# Patient Record
Sex: Female | Born: 1980 | Race: White | Hispanic: No | Smoking: Former smoker
Health system: Southern US, Community
[De-identification: ages and names within clinical notes are randomized; demographics above are authoritative.]

## PROBLEM LIST (undated history)

## (undated) DIAGNOSIS — J45909 Unspecified asthma, uncomplicated: Secondary | ICD-10-CM

---

## 2006-03-09 ENCOUNTER — Emergency Department: Payer: Self-pay | Admitting: Emergency Medicine

## 2006-04-18 ENCOUNTER — Emergency Department: Payer: Self-pay | Admitting: Unknown Physician Specialty

## 2006-07-12 ENCOUNTER — Emergency Department: Payer: Self-pay | Admitting: Emergency Medicine

## 2020-01-23 ENCOUNTER — Encounter: Payer: Self-pay | Admitting: Emergency Medicine

## 2020-01-23 ENCOUNTER — Emergency Department: Payer: Medicaid Other

## 2020-01-23 ENCOUNTER — Other Ambulatory Visit: Payer: Self-pay

## 2020-01-23 ENCOUNTER — Emergency Department
Admission: EM | Admit: 2020-01-23 | Discharge: 2020-01-23 | Disposition: A | Discharge status: Home / Self Care | Payer: Medicaid Other | Attending: Emergency Medicine | Admitting: Emergency Medicine

## 2020-01-23 DIAGNOSIS — J4521 Mild intermittent asthma with (acute) exacerbation: Secondary | ICD-10-CM | POA: Insufficient documentation

## 2020-01-23 DIAGNOSIS — Z20822 Contact with and (suspected) exposure to covid-19: Secondary | ICD-10-CM | POA: Insufficient documentation

## 2020-01-23 DIAGNOSIS — F17210 Nicotine dependence, cigarettes, uncomplicated: Secondary | ICD-10-CM | POA: Insufficient documentation

## 2020-01-23 HISTORY — DX: Unspecified asthma, uncomplicated: J45.909

## 2020-01-23 LAB — RESPIRATORY PANEL BY RT PCR (FLU A&B, COVID)
Influenza A by PCR: NEGATIVE
Influenza B by PCR: NEGATIVE
SARS Coronavirus 2 by RT PCR: NEGATIVE

## 2020-01-23 MED ORDER — MONTELUKAST SODIUM 10 MG PO TABS
10.0000 mg | ORAL_TABLET | Freq: Every day | ORAL | 0 refills | Status: AC
Start: 2020-01-23 — End: 2021-01-22

## 2020-01-23 NOTE — ED Provider Notes (Signed)
[facility] Emergency Department Provider Note   ____________________________________________   First MD Initiated Contact with Patient 01/23/20 1403     (approximate)  I have reviewed the triage vital signs and the nursing notes.   HISTORY  Chief Complaint Cough   HPI Dana Garza is a 39 y.o. female presents to the ED with complaint of cough and wheezing.  Patient states that 2 weeks ago she was seen for her asthma and URI at which time she was Covid negative.  Patient states that she went out of town and did not finish the complete dose of prednisone.  She was prescribed a Z-Pak which she did finish.  Patient states she was out in the yard yesterday and believes that her allergies have made her asthma worse.  She continues to use her inhaler.  She also is requesting another Covid test as she works in Teacher, music.  She also has not received the Covid vaccine as of today.  She rates her pain as a 0/10.      Past Medical History:  Diagnosis Date  . Asthma     There are no problems to display for this patient.   History reviewed. No pertinent surgical history.  Prior to Admission medications   Medication Sig Start Date End Date Taking? Authorizing Provider  albuterol (VENTOLIN HFA) 108 (90 Base) MCG/ACT inhaler Inhale 2 puffs into the lungs every 6 (six) hours as needed for wheezing or shortness of breath.   Yes [provider]  beclomethasone (QVAR) 40 MCG/ACT inhaler Inhale into the lungs 2 (two) times daily.   Yes [provider]  fluticasone (FLONASE) 50 MCG/ACT nasal spray Place 1 spray into both nostrils daily.   Yes [provider]  ipratropium-albuterol (DUONEB) 0.5-2.5 (3) MG/3ML SOLN Take 3 mLs by nebulization.   Yes [provider]  montelukast (SINGULAIR) 10 MG tablet Take 1 tablet (10 mg total) by mouth at bedtime. 01/23/20 01/22/21  Tommi Rumps, PA-C    Allergies Sulfa antibiotics  History  reviewed. No pertinent family history.  Social History Social History   Tobacco Use  . Smoking status: Former Games developer  . Smokeless tobacco: Never Used  Substance Use Topics  . Alcohol use: Not on file  . Drug use: Not on file    Review of Systems Constitutional: No fever/chills Eyes: No visual changes. ENT: No sore throat.  Negative for ear pain.  Positive for seasonal allergies. Cardiovascular: Denies chest pain. Respiratory: Denies shortness of breath.  Positive for cough and wheezing. Gastrointestinal: No abdominal pain.  No nausea, no vomiting.  Musculoskeletal: Negative for back pain. Skin: Negative for rash. Neurological: Negative for headaches, focal weakness or numbness. ____________________________________________   PHYSICAL EXAM:  VITAL SIGNS: ED Triage Vitals  Enc Vitals Group     BP 01/23/20 1339 113/71     Pulse Rate 01/23/20 1339 88     Resp 01/23/20 1339 18     Temp 01/23/20 1339 98.2 F (36.8 C)     Temp Source 01/23/20 1339 Oral     SpO2 01/23/20 1339 98 %     Weight 01/23/20 1340 253 lb (114.8 kg)     Height 01/23/20 1340 5\' 7"  (1.702 m)     Head Circumference --      Peak Flow --      Pain Score 01/23/20 1340 0     Pain Loc --      Pain Edu? --  Excl. in [place]? --    Constitutional: Alert and oriented. Well appearing and in no acute distress. Eyes: Conjunctivae are normal.  Head: Atraumatic. Nose: No congestion/rhinnorhea. Neck: No stridor.   Cardiovascular: Normal rate, regular rhythm. Grossly normal heart sounds.  Good peripheral circulation. Respiratory: Normal respiratory effort.  No retractions. Lungs occasional faint expiratory wheezes heard and patient also has a nonproductive cough.  Patient is also a cigarette smoker. Gastrointestinal: Soft and nontender. No distention. No abdominal bruits. No CVA tenderness. Musculoskeletal: Moves upper and lower extremities with any difficulty normal gait was noted. Neurologic:  Normal speech and  language. No gross focal neurologic deficits are appreciated. No gait instability. Skin:  Skin is warm, dry and intact. No rash noted. Psychiatric: Mood and affect are normal. Speech and behavior are normal.  ____________________________________________   LABS (all labs ordered are listed, but only abnormal results are displayed)  Labs Reviewed  RESPIRATORY PANEL BY RT PCR (FLU A&B, COVID)    RADIOLOGY  Official radiology report(s): DG Chest Portable 1 View  Result Date: 01/23/2020 CLINICAL DATA:  Cough 2 weeks ago with wheezing. Negative COVID-19 test. EXAM: PORTABLE CHEST 1 VIEW COMPARISON:  None. FINDINGS: Lungs are adequately inflated and otherwise clear. Cardiomediastinal silhouette, bones and soft tissues are normal. IMPRESSION: No active disease. Electronically Signed   By: Marin Olp M.D.   On: 01/23/2020 15:02    ____________________________________________   PROCEDURES  Procedure(s) performed (including Critical Care):  Procedures   ____________________________________________   INITIAL IMPRESSION / ASSESSMENT AND PLAN / ED COURSE  As part of my medical decision making, I reviewed the following data within the electronic MEDICAL RECORD NUMBER Notes from prior ED visits and [place] Controlled Substance [place] was evaluated in Emergency Department on 01/23/2020 for the symptoms described in the history of present illness. She was evaluated in the context of the global COVID-19 pandemic, which necessitated consideration that the patient might be at risk for infection with the SARS-CoV-2 virus that causes COVID-19. Institutional protocols and algorithms that pertain to the evaluation of patients at risk for COVID-19 are in a state of rapid change based on information released by regulatory bodies including the CDC and federal and state organizations. These policies and algorithms were followed during the patient's care in the ED.  39 year old female presents  to the ED with complaint of URI and asthma exacerbation.  Patient states she was diagnosed with a URI on 01/12/2020 at which time she was prescribed a Z-Pak and prednisone.  She states she did not take the prednisone completely but did finish the Z-Pak.  Patient chest x-ray was negative for any acute changes.  Patient was made aware that most likely the allergy season has exacerbated her asthma.  Patient will continue using her inhaler and also a prescription for Singulair was sent to the pharmacy.  She is to follow-up with her PCP if any continued problems. ____________________________________________   FINAL CLINICAL IMPRESSION(S) / ED DIAGNOSES  Final diagnoses:  Mild intermittent asthma with exacerbation  Cigarette smoker     ED Discharge Orders         Ordered    montelukast (SINGULAIR) 10 MG tablet  Daily at bedtime     01/23/20 1510           Note:  This document was prepared using Dragon voice recognition software and may include unintentional dictation errors.    Johnn Hai, PA-C 01/23/20 1611    Lavonia Drafts, MD 01/27/20 [id number]

## 2020-01-23 NOTE — ED Notes (Signed)
See triage note  Presents with cough and wheezing  States she was treated for URI about 2 weeks ago  Had negative COVID test at that time  States she went out of town  Became fatigued  Worked in yard and developed wheezing and SOB  Afebrile at present

## 2020-01-23 NOTE — Discharge Instructions (Addendum)
Follow-up with your primary care provider or can no clinic acute care if any continued problems.  Continue using your inhaler as directed.  Singulair is added to your asthma routine.  Increase fluids.  Also decrease or discontinue smoking.  Chest x-ray today does not show any signs of infection.  You may see the results of your Covid test on my chart.

## 2020-01-23 NOTE — ED Triage Notes (Signed)
Pt arrived via POV with reports of being dx with URI on 4/21, was prescribed Z-pak and completed course, was also prescribed prednisone, but did not complete course in succession due to traveling out of the state and leaving medications behind. Pt states when she got back she took the last 2 doses of prednisone and felt better.  Pt states she has also been working out in the yard so thinks her allergies have exacerbated her asthma.  Uses inhaler at home.  Requesting to have another COVID test done, first one was negative at [facility].

## 2020-01-27 ENCOUNTER — Other Ambulatory Visit: Payer: Self-pay

## 2020-01-27 ENCOUNTER — Emergency Department
Admission: EM | Admit: 2020-01-27 | Discharge: 2020-01-27 | Disposition: A | Discharge status: Home / Self Care | Payer: Medicaid Other | Attending: Emergency Medicine | Admitting: Emergency Medicine

## 2020-01-27 DIAGNOSIS — K529 Noninfective gastroenteritis and colitis, unspecified: Secondary | ICD-10-CM | POA: Insufficient documentation

## 2020-01-27 DIAGNOSIS — Z79899 Other long term (current) drug therapy: Secondary | ICD-10-CM | POA: Insufficient documentation

## 2020-01-27 DIAGNOSIS — Z20822 Contact with and (suspected) exposure to covid-19: Secondary | ICD-10-CM | POA: Insufficient documentation

## 2020-01-27 DIAGNOSIS — Z87891 Personal history of nicotine dependence: Secondary | ICD-10-CM | POA: Insufficient documentation

## 2020-01-27 LAB — COMPREHENSIVE METABOLIC PANEL
ALT: 21 U/L (ref 0–44)
AST: 16 U/L (ref 15–41)
Albumin: 4.4 g/dL (ref 3.5–5.0)
Alkaline Phosphatase: 37 U/L — ABNORMAL LOW (ref 38–126)
Anion gap: 10 (ref 5–15)
BUN: 14 mg/dL (ref 6–20)
CO2: 22 mmol/L (ref 22–32)
Calcium: 9.4 mg/dL (ref 8.9–10.3)
Chloride: 108 mmol/L (ref 98–111)
Creatinine, Ser: 0.78 mg/dL (ref 0.44–1.00)
GFR calc Af Amer: >60 mL/min (ref >60)
GFR calc non Af Amer: >60 mL/min (ref >60)
Glucose, Bld: 147 mg/dL — ABNORMAL HIGH (ref 70–99)
Potassium: 3.8 mmol/L (ref 3.5–5.1)
Sodium: 140 mmol/L (ref 135–145)
Total Bilirubin: 1.2 mg/dL (ref 0.3–1.2)
Total Protein: 7.1 g/dL (ref 6.5–8.1)

## 2020-01-27 LAB — CBC
HCT: 45.9 % (ref 36.0–46.0)
Hemoglobin: 16.4 g/dL — ABNORMAL HIGH (ref 12.0–15.0)
MCH: 30.3 pg (ref 26.0–34.0)
MCHC: 35.7 g/dL (ref 30.0–36.0)
MCV: 84.7 fL (ref 80.0–100.0)
Platelets: 241 10*3/uL (ref 150–400)
RBC: 5.42 MIL/uL — ABNORMAL HIGH (ref 3.87–5.11)
RDW: 13 % (ref 11.5–15.5)
WBC: 15.9 10*3/uL — ABNORMAL HIGH (ref 4.0–10.5)
nRBC: 0 % (ref 0.0–0.2)

## 2020-01-27 LAB — LIPASE, BLOOD: Lipase: 24 U/L (ref 11–51)

## 2020-01-27 LAB — POC SARS CORONAVIRUS 2 AG: SARS Coronavirus 2 Ag: NEGATIVE

## 2020-01-27 MED ORDER — ONDANSETRON 4 MG PO TBDP
4.0000 mg | ORAL_TABLET | Freq: Once | ORAL | Status: AC | PRN
  Administered 2020-01-27: 4 mg via ORAL
  Filled 2020-01-27: qty 1

## 2020-01-27 MED ORDER — ONDANSETRON 4 MG PO TBDP: ORAL_TABLET | ORAL | 0 refills | Status: AC

## 2020-01-27 MED ORDER — ONDANSETRON 4 MG PO TBDP
4.0000 mg | ORAL_TABLET | Freq: Once | ORAL | Status: AC
  Administered 2020-01-27: 4 mg via ORAL
  Filled 2020-01-27: qty 1

## 2020-01-27 MED ORDER — SODIUM CHLORIDE 0.9% FLUSH: 3.0000 mL | Freq: Once | INTRAVENOUS | Status: DC

## 2020-01-27 NOTE — ED Provider Notes (Signed)
[facility] Emergency Department Provider Note  ____________________________________________   First MD Initiated Contact with Patient 01/27/20 2257     (approximate)  I have reviewed the triage vital signs and the nursing notes.   HISTORY  Chief Complaint Abdominal Pain    HPI Dana Garza is a 39 y.o. female with no contributory past medical history who presents for evaluation of acute onset and severe and recurrent episodes of vomiting and diarrhea.  This started a few minutes before coming to the emergency department which was nearly 5-1/2 hours prior to my assessment of the patient.  She is having some intermittent sharp and cramping abdominal pain but nothing consistent and seem to be related to the multiple episodes of vomiting.  She said that it was "coming out both ends" at the same time so she was having to lie in the bathroom floor so she would be near the toilet .  She does not think it was anything she ate but thinks that something going through the family; at least one of her children and another family member have had similar symptoms over the last couple of days.  She was tested for Covid with a PCR test 4 days ago which was negative but she wanted to be tested again tonight.  No fever/chills, sore throat, or acute chest pain or shortness of breath although she has had some shortness of breath recently as well as a cough (for which she was seen 4 days ago when she was tested for Covid).  She describes the symptoms as severe and nothing in particular makes them better or worse.  She said that she did feel little bit better after getting the Zofran earlier in her body has calm down, she has not vomited or had diarrhea more than an hour, and she is feeling much better than she did earlier.          Past Medical History:  Diagnosis Date  . Asthma     There are no problems to display for this patient.   History reviewed. No pertinent  surgical history.  Prior to Admission medications   Medication Sig Start Date End Date Taking? Authorizing Provider  albuterol (VENTOLIN HFA) 108 (90 Base) MCG/ACT inhaler Inhale 2 puffs into the lungs every 6 (six) hours as needed for wheezing or shortness of breath.   Yes [provider]  beclomethasone (QVAR) 40 MCG/ACT inhaler Inhale into the lungs 2 (two) times daily.   Yes [provider]  fluticasone (FLONASE) 50 MCG/ACT nasal spray Place 1 spray into both nostrils daily.   Yes [provider]  ipratropium-albuterol (DUONEB) 0.5-2.5 (3) MG/3ML SOLN Take 3 mLs by nebulization every 4 (four) hours as needed.    Yes [provider]  montelukast (SINGULAIR) 10 MG tablet Take 1 tablet (10 mg total) by mouth at bedtime. 01/23/20 01/22/21 Yes Bridget Hartshorn L, PA-C  ondansetron (ZOFRAN ODT) 4 MG disintegrating tablet Allow 1-2 tablets to dissolve in your mouth every 8 hours as needed for nausea/vomiting 01/27/20   Loleta Rose, MD    Allergies Sulfa antibiotics  No family history on file.  Social History Social History   Tobacco Use  . Smoking status: Former Games developer  . Smokeless tobacco: Never Used  Substance Use Topics  . Alcohol use: Not on file  . Drug use: Not on file    Review of Systems Constitutional: No fever/chills Eyes: No visual changes. ENT: No sore throat. Cardiovascular: Denies chest pain.  Respiratory: Denies acute shortness of breath although she has had some recent cough and wheezing. Gastrointestinal: Acute onset vomiting and diarrhea, intermittent cramping abdominal pain with occasional sharp pain but nothing persistent.  Improved over the last hour. Genitourinary: Negative for dysuria. Musculoskeletal: Negative for neck pain.  Negative for back pain. Integumentary: Negative for rash. Neurological: Negative for headaches, focal weakness or numbness.   ____________________________________________   PHYSICAL EXAM:  VITAL  SIGNS: ED Triage Vitals  Enc Vitals Group     BP 01/27/20 1812 136/69     Pulse Rate 01/27/20 1812 70     Resp 01/27/20 1812 18     Temp 01/27/20 1815 (!) 97.4 F (36.3 C)     Temp Source 01/27/20 1815 Oral     SpO2 01/27/20 1812 99 %     Weight 01/27/20 1810 114.8 kg (253 lb)     Height 01/27/20 1810 1.702 m (5\' 7" )     Head Circumference --      Peak Flow --      Pain Score 01/27/20 1810 6     Pain Loc --      Pain Edu? --      Excl. in [place]? --     Constitutional: Alert and oriented.  Eyes: Conjunctivae are normal.  Head: Atraumatic. Nose: No congestion/rhinnorhea. Mouth/Throat: Patient is wearing a mask. Neck: No stridor.  No meningeal signs.   Cardiovascular: Normal rate, regular rhythm. Good peripheral circulation. Grossly normal heart sounds. Respiratory: Normal respiratory effort.  No retractions. Gastrointestinal: Soft and nontender. No distention.  Musculoskeletal: No lower extremity tenderness nor edema. No gross deformities of extremities. Neurologic:  Normal speech and language. No gross focal neurologic deficits are appreciated.  Skin:  Skin is warm, dry and intact. Psychiatric: Mood and affect are normal. Speech and behavior are normal.  ____________________________________________   LABS (all labs ordered are listed, but only abnormal results are displayed)  Labs Reviewed  COMPREHENSIVE METABOLIC PANEL - Abnormal; Notable for the following components:      Result Value   Glucose, Bld 147 (*)    Alkaline Phosphatase 37 (*)    All other components within normal limits  CBC - Abnormal; Notable for the following components:   WBC 15.9 (*)    RBC 5.42 (*)    Hemoglobin 16.4 (*)    All other components within normal limits  LIPASE, BLOOD  URINALYSIS, COMPLETE (UACMP) WITH MICROSCOPIC  POC SARS CORONAVIRUS 2 AG -  ED  POC SARS CORONAVIRUS 2 AG  POC URINE PREG, ED   ____________________________________________  EKG  None - EKG not ordered by ED  physician ____________________________________________  RADIOLOGY Ursula Alert, personally viewed and evaluated these images (plain radiographs) as part of my medical decision making, as well as reviewing the written report by the radiologist.  ED MD interpretation: No indication for emergent imaging  Official radiology report(s): No results found.  ____________________________________________   PROCEDURES   Procedure(s) performed (including Critical Care):  Procedures   ____________________________________________   INITIAL IMPRESSION / MDM / [place] / ED COURSE  As part of my medical decision making, I reviewed the following data within the [facility] notes reviewed and incorporated, Labs reviewed , Old chart reviewed, Notes from prior ED visits and [place] Controlled Substance Database   Differential diagnosis includes, but is not limited to, viral gastroenteritis, bacterial enteritis such as Salmonella or Campylobacter, other nonspecific intra-abdominal infection, COVID-19.  The patient has had a recent negative  PCR test and she received a negative rapid antigen test tonight.  She has no other acute respiratory symptoms.  Her vital signs are stable and within normal limits.  She has been in the emergency department 5 and half hours and has had no vomiting or diarrhea for the last hour.  She is feeling much better and has no tenderness to palpation of her abdomen.  Her labs demonstrate a moderate leukocytosis which is nonspecific and not necessarily indicative of a bacterial infection or need for additional imaging given her reassuring abdominal exam.  She is tolerating ginger ale in the ED and wants to go home.  She has multiple family members with similar symptoms.  I am discharging the patient with a prescription for Zofran and my usual and customary management recommendations and return precautions.  She understands and agrees with the  plan.    ____________________________________________  FINAL CLINICAL IMPRESSION(S) / ED DIAGNOSES  Final diagnoses:  Gastroenteritis     MEDICATIONS GIVEN DURING THIS VISIT:  Medications  sodium chloride flush (NS) 0.9 % injection 3 mL (3 mLs Intravenous Not Given 01/27/20 2311)  ondansetron (ZOFRAN-ODT) disintegrating tablet 4 mg (has no administration in time range)  ondansetron (ZOFRAN-ODT) disintegrating tablet 4 mg (4 mg Oral Given 01/27/20 1816)     ED Discharge Orders         Ordered    ondansetron (ZOFRAN ODT) 4 MG disintegrating tablet     01/27/20 2317          *Please note:  Dana Garza was evaluated in Emergency Department on 01/27/2020 for the symptoms described in the history of present illness. She was evaluated in the context of the global COVID-19 pandemic, which necessitated consideration that the patient might be at risk for infection with the SARS-CoV-2 virus that causes COVID-19. Institutional protocols and algorithms that pertain to the evaluation of patients at risk for COVID-19 are in a state of rapid change based on information released by regulatory bodies including the CDC and federal and state organizations. These policies and algorithms were followed during the patient's care in the ED.  Some ED evaluations and interventions may be delayed as a result of limited staffing during the pandemic.*  Note:  This document was prepared using Dragon voice recognition software and may include unintentional dictation errors.   Loleta Rose, MD 01/27/20 2320

## 2020-01-27 NOTE — ED Notes (Signed)
Pt stuck twice was able to collect red top and sent to lab. Pt squirming around and wouldn't sit still to collect the rest of labs.

## 2020-01-27 NOTE — Discharge Instructions (Addendum)

## 2020-01-27 NOTE — ED Triage Notes (Signed)
Pt comes via [facility] from home with c/o N/V and abdominal pain that started a few minutes ago.  Pt states she thinks she has a virus.

## 2020-05-10 ENCOUNTER — Other Ambulatory Visit: Payer: Self-pay

## 2020-05-10 ENCOUNTER — Ambulatory Visit
Admission: EM | Admit: 2020-05-10 | Discharge: 2020-05-10 | Disposition: A | Discharge status: Home / Self Care | Payer: Medicaid Other | Attending: Internal Medicine | Admitting: Internal Medicine

## 2020-05-10 DIAGNOSIS — Z20822 Contact with and (suspected) exposure to covid-19: Secondary | ICD-10-CM

## 2020-05-10 LAB — SARS CORONAVIRUS 2 (TAT 6-24 HRS): SARS Coronavirus 2: NEGATIVE

## 2020-05-10 NOTE — ED Triage Notes (Signed)
Patient in today for COVID exposure. 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

## 2020-08-06 ENCOUNTER — Emergency Department
Admission: EM | Admit: 2020-08-06 | Discharge: 2020-08-06 | Disposition: A | Discharge status: Home / Self Care | Payer: Medicaid Other | Attending: Emergency Medicine | Admitting: Emergency Medicine

## 2020-08-06 ENCOUNTER — Encounter: Payer: Self-pay | Admitting: Physician Assistant

## 2020-08-06 ENCOUNTER — Other Ambulatory Visit: Payer: Self-pay

## 2020-08-06 DIAGNOSIS — J45909 Unspecified asthma, uncomplicated: Secondary | ICD-10-CM | POA: Insufficient documentation

## 2020-08-06 DIAGNOSIS — Z87891 Personal history of nicotine dependence: Secondary | ICD-10-CM | POA: Insufficient documentation

## 2020-08-06 DIAGNOSIS — Z7951 Long term (current) use of inhaled steroids: Secondary | ICD-10-CM | POA: Insufficient documentation

## 2020-08-06 DIAGNOSIS — L03311 Cellulitis of abdominal wall: Secondary | ICD-10-CM | POA: Insufficient documentation

## 2020-08-06 LAB — BASIC METABOLIC PANEL
Anion gap: 10 (ref 5–15)
BUN: 12 mg/dL (ref 6–20)
CO2: 26 mmol/L (ref 22–32)
Calcium: 9.1 mg/dL (ref 8.9–10.3)
Chloride: 103 mmol/L (ref 98–111)
Creatinine, Ser: 0.75 mg/dL (ref 0.44–1.00)
GFR, Estimated: >60 mL/min (ref >60)
Glucose, Bld: 112 mg/dL — ABNORMAL HIGH (ref 70–99)
Potassium: 4.2 mmol/L (ref 3.5–5.1)
Sodium: 139 mmol/L (ref 135–145)

## 2020-08-06 LAB — CBC WITH DIFFERENTIAL/PLATELET
Abs Immature Granulocytes: 0.04 10*3/uL (ref 0.00–0.07)
Basophils Absolute: 0 10*3/uL (ref 0.0–0.1)
Basophils Relative: 0 %
Eosinophils Absolute: 0.3 10*3/uL (ref 0.0–0.5)
Eosinophils Relative: 3 %
HCT: 43.6 % (ref 36.0–46.0)
Hemoglobin: 14.6 g/dL (ref 12.0–15.0)
Immature Granulocytes: 0 %
Lymphocytes Relative: 10 %
Lymphs Abs: 1.3 10*3/uL (ref 0.7–4.0)
MCH: 29.9 pg (ref 26.0–34.0)
MCHC: 33.5 g/dL (ref 30.0–36.0)
MCV: 89.2 fL (ref 80.0–100.0)
Monocytes Absolute: 0.7 10*3/uL (ref 0.1–1.0)
Monocytes Relative: 5 %
Neutro Abs: 10.7 10*3/uL — ABNORMAL HIGH (ref 1.7–7.7)
Neutrophils Relative %: 82 %
Platelets: 207 10*3/uL (ref 150–400)
RBC: 4.89 MIL/uL (ref 3.87–5.11)
RDW: 13.3 % (ref 11.5–15.5)
WBC: 13.1 10*3/uL — ABNORMAL HIGH (ref 4.0–10.5)
nRBC: 0 % (ref 0.0–0.2)

## 2020-08-06 MED ORDER — TRAMADOL HCL 50 MG PO TABS: 50.0000 mg | ORAL_TABLET | Freq: Three times a day (TID) | ORAL | 0 refills | Status: AC | PRN

## 2020-08-06 MED ORDER — DOXYCYCLINE HYCLATE 100 MG PO TABS
100.0000 mg | ORAL_TABLET | Freq: Two times a day (BID) | ORAL | 0 refills | Status: AC
Start: 2020-08-06 — End: ?

## 2020-08-06 MED ORDER — CLINDAMYCIN HCL 150 MG PO CAPS
300.0000 mg | ORAL_CAPSULE | Freq: Once | ORAL | Status: AC
  Administered 2020-08-06: 300 mg via ORAL
  Filled 2020-08-06: qty 2

## 2020-08-06 MED ORDER — CLINDAMYCIN HCL 300 MG PO CAPS
300.0000 mg | ORAL_CAPSULE | Freq: Three times a day (TID) | ORAL | 0 refills | Status: DC
Start: 2020-08-06 — End: 2020-08-06

## 2020-08-06 MED ORDER — CEPHALEXIN 500 MG PO CAPS: 500.0000 mg | ORAL_CAPSULE | Freq: Three times a day (TID) | ORAL | 0 refills | Status: AC

## 2020-08-06 NOTE — Discharge Instructions (Signed)
Your exam and labs are essentially normal at this time.  You do have a nonpurulent cellulitis of the abdominal wall.  Take the prescription antibiotic as directed.  Keep the area clean, dry, and covered if necessary.  Follow-up with your primary provider return to the ED if needed.

## 2020-08-06 NOTE — ED Notes (Signed)
Pt presents to the ED for a rash on her R lower abdomen. Pt states she noticed 3 days ago and it has gotten bigger and more painful in the last 3 days. 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 Pt is A&Ox4 and NAD

## 2020-08-06 NOTE — ED Triage Notes (Signed)
Pt called from [facility] to triage, no response 

## 2020-08-06 NOTE — ED Triage Notes (Signed)
Pt presents via POV c/o infection to right lower abd x3 days with acute worsening. Reports has not seen MD for abx up to this point. Reports hx MRSA.

## 2020-08-07 NOTE — ED Provider Notes (Signed)
[facility] Emergency Department Provider Note ____________________________________________  Time seen: 1240  I have reviewed the triage vital signs and the nursing notes.  HISTORY  Chief Complaint  Cellulitis  HPI BAYAN HEDSTROM is a 39 y.o. female presents her self to the ED for evaluation of a nonpurulent cellulitis to the trunk.  Patient with no significant medical history, presents with report of recurrent skin infection to the abdomen.  She reports the area began as a small pustule a few days ago, and it quickly spread to a large area of erythema.  She describes tenderness and warmth from the area.  She denies any interim  fevers, chills, sweats patient also denies any fluctuance or purulence from the area.  She has been treated in the past and had good response with clindamycin.  Past Medical History:  Diagnosis Date  . Asthma     There are no problems to display for this patient.   History reviewed. No pertinent surgical history.  Prior to Admission medications   Medication Sig Start Date End Date Taking? Authorizing Provider  albuterol (VENTOLIN HFA) 108 (90 Base) MCG/ACT inhaler Inhale 2 puffs into the lungs every 6 (six) hours as needed for wheezing or shortness of breath.    [provider]  beclomethasone (QVAR) 40 MCG/ACT inhaler Inhale into the lungs 2 (two) times daily.    [provider]  cephALEXin (KEFLEX) 500 MG capsule Take 1 capsule (500 mg total) by mouth 3 (three) times daily for 7 days. 08/06/20 08/13/20  Laurana Magistro, Charlesetta Ivory, PA-C  doxycycline (VIBRA-TABS) 100 MG tablet Take 1 tablet (100 mg total) by mouth 2 (two) times daily. 08/06/20   Merida Alcantar, Charlesetta Ivory, PA-C  fluticasone (FLONASE) 50 MCG/ACT nasal spray Place 1 spray into both nostrils daily.    [provider]  ipratropium-albuterol (DUONEB) 0.5-2.5 (3) MG/3ML SOLN Take 3 mLs by nebulization every 4 (four) hours as needed.     [provider]  montelukast (SINGULAIR) 10 MG tablet Take 1 tablet (10 mg total) by mouth at bedtime. 01/23/20 01/22/21  Tommi Rumps, PA-C  ondansetron (ZOFRAN ODT) 4 MG disintegrating tablet Allow 1-2 tablets to dissolve in your mouth every 8 hours as needed for nausea/vomiting 01/27/20   Loleta Rose, MD  traMADol (ULTRAM) 50 MG tablet Take 1 tablet (50 mg total) by mouth 3 (three) times daily as needed for up to 3 days. 08/06/20 08/09/20  Terrill Alperin, Charlesetta Ivory, PA-C    Allergies Sulfa antibiotics  History reviewed. No pertinent family history.  Social History Social History   Tobacco Use  . Smoking status: Former Games developer  . Smokeless tobacco: Never Used  Vaping Use  . Vaping Use: Every day  Substance Use Topics  . Alcohol use: Not on file  . Drug use: Not on file    Review of Systems  Constitutional: Negative for fever. Eyes: Negative for visual changes. ENT: Negative for sore throat. Cardiovascular: Negative for chest pain. Respiratory: Negative for shortness of breath. Gastrointestinal: Negative for abdominal pain, vomiting and diarrhea. Genitourinary: Negative for dysuria. Musculoskeletal: Negative for back pain. Skin: Negative for rash.  Reports abdominal wall cellulitis as above. Neurological: Negative for headaches, focal weakness or numbness. ____________________________________________  PHYSICAL EXAM:  VITAL SIGNS: ED Triage Vitals  Enc Vitals Group     BP 08/06/20 1211 123/70     Pulse Rate 08/06/20 1211 88     Resp 08/06/20 1211 14     Temp 08/06/20  1211 99.3 F (37.4 C)     Temp Source 08/06/20 1211 Oral     SpO2 08/06/20 1211 96 %     Weight --      Height --      Head Circumference --      Peak Flow --      Pain Score 08/06/20 1212 4     Pain Loc --      Pain Edu? --      Excl. in GC? --     Constitutional: Alert and oriented. Well appearing and in no distress. Head: Normocephalic and atraumatic. Eyes: Conjunctivae are normal. Normal  extraocular movements Cardiovascular: Normal rate, regular rhythm. Normal distal pulses. Respiratory: Normal respiratory effort. No wheezes/rales/rhonchi. Gastrointestinal: Soft and nontender. No distention, rebound, guarding, or rigidity.  Patient with an area measuring approximately 5 x 8cm well-demarcated erythematous skin that is mildly tender to palpation and mildly indurated.  There is a single papule within the same area, is currently scabbed over.  This represents the entry point for the infection.  No weeping, purulence, pointing, or fluctuance is appreciated. Musculoskeletal: Nontender with normal range of motion in all extremities.  Neurologic:  Normal gait without ataxia. Normal speech and language. No gross focal neurologic deficits are appreciated. Skin:  Skin is warm, dry and intact. No rash noted. Psychiatric: Mood and affect are normal. Patient exhibits appropriate insight and judgment. ____________________________________________   LABS (pertinent positives/negatives)  Labs Reviewed  CBC WITH DIFFERENTIAL/PLATELET - Abnormal; Notable for the following components:      Result Value   WBC 13.1 (*)    Neutro Abs 10.7 (*)    All other components within normal limits  BASIC METABOLIC PANEL - Abnormal; Notable for the following components:   Glucose, Bld 112 (*)    All other components within normal limits  ____________________________________________  PROCEDURES  Clindamycin 300 mg PO  Procedures ____________________________________________  INITIAL IMPRESSION / ASSESSMENT AND PLAN / ED COURSE  DDX: cellulitis, erysipelas, abscess, yeast dermatitis, contact dermatitis  Patient with no significant medical history presents for evaluation of recurrent abdominal wall cellulitis patient presents with a nonpurulent nonweeping cellulitis to the right lower abdominal wall.  The area is consistent with an cutaneous cellulitis.  She will be started on Keflex and doxycycline for  coverage of MSSA and MRSA, respectively.  A small prescription of tramadol provided for pain relief benefit.  She will follow-up with primary provider return to the ED if needed.  SKYYLAR KOPF was evaluated in Emergency Department on 08/07/2020 for the symptoms described in the history of present illness. She was evaluated in the context of the global COVID-19 pandemic, which necessitated consideration that the patient might be at risk for infection with the SARS-CoV-2 virus that causes COVID-19. Institutional protocols and algorithms that pertain to the evaluation of patients at risk for COVID-19 are in a state of rapid change based on information released by regulatory bodies including the CDC and federal and state organizations. These policies and algorithms were followed during the patient's care in the ED.  I reviewed the patient's prescription history over the last 12 months in the multi-state controlled substances database(s) that includes [place], [place], [place], [place], [place], [place], [place], [place], [place], [place], [place], [place], [place], and [place].  Results were notable for no RX history.  ____________________________________________  FINAL CLINICAL IMPRESSION(S) / ED DIAGNOSES  Final diagnoses:  Cellulitis of abdominal wall      Laurena Valko, Charlesetta Ivory, PA-C  08/07/20 1124    Merwyn Katos, MD 08/09/20 [id number]

## 2020-08-11 ENCOUNTER — Other Ambulatory Visit: Payer: Self-pay

## 2020-08-11 ENCOUNTER — Emergency Department
Admission: EM | Admit: 2020-08-11 | Discharge: 2020-08-12 | Disposition: A | Discharge status: Home / Self Care | Payer: Medicaid Other | Attending: Emergency Medicine | Admitting: Emergency Medicine

## 2020-08-11 DIAGNOSIS — L02211 Cutaneous abscess of abdominal wall: Secondary | ICD-10-CM | POA: Insufficient documentation

## 2020-08-11 DIAGNOSIS — L03311 Cellulitis of abdominal wall: Secondary | ICD-10-CM | POA: Insufficient documentation

## 2020-08-11 DIAGNOSIS — Z5321 Procedure and treatment not carried out due to patient leaving prior to being seen by health care provider: Secondary | ICD-10-CM | POA: Insufficient documentation

## 2020-08-11 NOTE — ED Triage Notes (Signed)
Patient recently treated for cellulitis to right abdomen.  Patient feels like area needs to be I&D'd.

## 2020-08-12 ENCOUNTER — Encounter: Payer: Self-pay | Admitting: Emergency Medicine

## 2020-08-12 ENCOUNTER — Other Ambulatory Visit: Payer: Self-pay

## 2020-08-12 ENCOUNTER — Emergency Department: Payer: Medicaid Other

## 2020-08-12 ENCOUNTER — Emergency Department
Admission: EM | Admit: 2020-08-12 | Discharge: 2020-08-12 | Disposition: A | Discharge status: Home / Self Care | Payer: Medicaid Other | Attending: Emergency Medicine | Admitting: Emergency Medicine

## 2020-08-12 DIAGNOSIS — Z7951 Long term (current) use of inhaled steroids: Secondary | ICD-10-CM | POA: Insufficient documentation

## 2020-08-12 DIAGNOSIS — L02211 Cutaneous abscess of abdominal wall: Secondary | ICD-10-CM | POA: Insufficient documentation

## 2020-08-12 DIAGNOSIS — L0291 Cutaneous abscess, unspecified: Secondary | ICD-10-CM

## 2020-08-12 DIAGNOSIS — J45909 Unspecified asthma, uncomplicated: Secondary | ICD-10-CM | POA: Insufficient documentation

## 2020-08-12 DIAGNOSIS — Z87891 Personal history of nicotine dependence: Secondary | ICD-10-CM | POA: Insufficient documentation

## 2020-08-12 LAB — CBC WITH DIFFERENTIAL/PLATELET
Abs Immature Granulocytes: 0.04 10*3/uL (ref 0.00–0.07)
Basophils Absolute: 0 10*3/uL (ref 0.0–0.1)
Basophils Relative: 1 %
Eosinophils Absolute: 0.5 10*3/uL (ref 0.0–0.5)
Eosinophils Relative: 6 %
HCT: 39.5 % (ref 36.0–46.0)
Hemoglobin: 13.5 g/dL (ref 12.0–15.0)
Immature Granulocytes: 1 %
Lymphocytes Relative: 21 %
Lymphs Abs: 1.7 10*3/uL (ref 0.7–4.0)
MCH: 29.9 pg (ref 26.0–34.0)
MCHC: 34.2 g/dL (ref 30.0–36.0)
MCV: 87.6 fL (ref 80.0–100.0)
Monocytes Absolute: 0.5 10*3/uL (ref 0.1–1.0)
Monocytes Relative: 6 %
Neutro Abs: 5.4 10*3/uL (ref 1.7–7.7)
Neutrophils Relative %: 65 %
Platelets: 288 10*3/uL (ref 150–400)
RBC: 4.51 MIL/uL (ref 3.87–5.11)
RDW: 13.2 % (ref 11.5–15.5)
WBC: 8.1 10*3/uL (ref 4.0–10.5)
nRBC: 0 % (ref 0.0–0.2)

## 2020-08-12 LAB — COMPREHENSIVE METABOLIC PANEL
ALT: 24 U/L (ref 0–44)
AST: 16 U/L (ref 15–41)
Albumin: 3.9 g/dL (ref 3.5–5.0)
Alkaline Phosphatase: 38 U/L (ref 38–126)
Anion gap: 8 (ref 5–15)
BUN: 10 mg/dL (ref 6–20)
CO2: 27 mmol/L (ref 22–32)
Calcium: 8.5 mg/dL — ABNORMAL LOW (ref 8.9–10.3)
Chloride: 103 mmol/L (ref 98–111)
Creatinine, Ser: 0.8 mg/dL (ref 0.44–1.00)
GFR, Estimated: >60 mL/min (ref >60)
Glucose, Bld: 99 mg/dL (ref 70–99)
Potassium: 3.5 mmol/L (ref 3.5–5.1)
Sodium: 138 mmol/L (ref 135–145)
Total Bilirubin: 0.6 mg/dL (ref 0.3–1.2)
Total Protein: 7 g/dL (ref 6.5–8.1)

## 2020-08-12 NOTE — ED Provider Notes (Signed)
[facility] Emergency Department Provider Note  ____________________________________________   First MD Initiated Contact with Patient 08/12/20 1329     (approximate)  I have reviewed the triage vital signs and the nursing notes.   HISTORY  Chief Complaint Abscess   HPI Dana Garza is a 39 y.o. female presents to the ED for questionable abscess to her right lower abdomen.  Patient is currently taking antibiotics and has already had the area I&D.  Patient at that time had skin area marked with a pen due to cellulitis.  Patient currently is being treated with 2 antibiotics and has taken it daily.  She denies any fever, chills, nausea or vomiting.  Area has decreased in size.  Patient reports that there is minimal drainage at this time.  She is fearful as she is able to feel a hard area under the skin where her initial I&D was done.  Currently she reports her pain as a 2 out of 10.       Past Medical History:  Diagnosis Date  . Asthma     There are no problems to display for this patient.   History reviewed. No pertinent surgical history.  Prior to Admission medications   Medication Sig Start Date End Date Taking? Authorizing Provider  albuterol (VENTOLIN HFA) 108 (90 Base) MCG/ACT inhaler Inhale 2 puffs into the lungs every 6 (six) hours as needed for wheezing or shortness of breath.    [provider]  beclomethasone (QVAR) 40 MCG/ACT inhaler Inhale into the lungs 2 (two) times daily.    [provider]  cephALEXin (KEFLEX) 500 MG capsule Take 1 capsule (500 mg total) by mouth 3 (three) times daily for 7 days. 08/06/20 08/13/20  Menshew, Charlesetta Ivory, PA-C  doxycycline (VIBRA-TABS) 100 MG tablet Take 1 tablet (100 mg total) by mouth 2 (two) times daily. 08/06/20   Menshew, Charlesetta Ivory, PA-C  fluticasone (FLONASE) 50 MCG/ACT nasal spray Place 1 spray into both nostrils daily.    [provider]  ipratropium-albuterol  (DUONEB) 0.5-2.5 (3) MG/3ML SOLN Take 3 mLs by nebulization every 4 (four) hours as needed.     [provider]  montelukast (SINGULAIR) 10 MG tablet Take 1 tablet (10 mg total) by mouth at bedtime. 01/23/20 01/22/21  Tommi Rumps, PA-C  ondansetron (ZOFRAN ODT) 4 MG disintegrating tablet Allow 1-2 tablets to dissolve in your mouth every 8 hours as needed for nausea/vomiting 01/27/20   Loleta Rose, MD    Allergies Sulfa antibiotics  History reviewed. No pertinent family history.  Social History Social History   Tobacco Use  . Smoking status: Former Games developer  . Smokeless tobacco: Never Used  Vaping Use  . Vaping Use: Every day  Substance Use Topics  . Alcohol use: Not on file  . Drug use: Not on file    Review of Systems Constitutional: No fever/chills Eyes: No visual changes. Cardiovascular: Denies chest pain. Respiratory: Denies shortness of breath. Gastrointestinal: No abdominal pain.  No nausea, no vomiting.   Genitourinary: Negative for dysuria. Musculoskeletal: Negative for muscle skeletal pain. Skin: Positive for abscess right lower quadrant area soft skin. Neurological: Negative for headaches, focal weakness or numbness. ____________________________________________   PHYSICAL EXAM:  VITAL SIGNS: ED Triage Vitals  Enc Vitals Group     BP 08/12/20 1255 (!) 119/59     Pulse Rate 08/12/20 1255 (!) 58     Resp 08/12/20 1255 18     Temp 08/12/20 1255 97.6  F (36.4 C)     Temp Source 08/12/20 1255 Oral     SpO2 08/12/20 1255 100 %     Weight 08/12/20 1256 263 lb (119.3 kg)     Height 08/12/20 1256 5\' 7"  (1.702 m)     Head Circumference --      Peak Flow --      Pain Score 08/12/20 1256 2     Pain Loc --      Pain Edu? --      Excl. in GC? --     Constitutional: Alert and oriented. Well appearing and in no acute distress. Eyes: Conjunctivae are normal.  Head: Atraumatic. Neck: No stridor.   Cardiovascular: Normal rate, regular rhythm. Grossly  normal heart sounds.  Good peripheral circulation. Respiratory: Normal respiratory effort.  No retractions. Lungs CTAB. Gastrointestinal: Soft and nontender. No distention.  Musculoskeletal: Patient is able move upper and lower extremities they have difficulty.  Normal gait was noted. Neurologic:  Normal speech and language. No gross focal neurologic deficits are appreciated.  Skin:  Skin is warm.  On examination of the abdomen right lower quadrant there is an open area that has minimal drainage.  There is erythema at the edges of the incision only.  The skin that was marked for cellulitis has completely receded from those lines and no erythema or warmth is noted on examination.  There is an area of firmness noted medially to this area that patient is concerned for and extending abscess.  No warmth or redness is noted in this area. Psychiatric: Mood and affect are normal. Speech and behavior are normal.  ____________________________________________   LABS (all labs ordered are listed, but only abnormal results are displayed)  Labs Reviewed  COMPREHENSIVE METABOLIC PANEL - Abnormal; Notable for the following components:      Result Value   Calcium 8.5 (*)    All other components within normal limits  CBC WITH DIFFERENTIAL/PLATELET    RADIOLOGY I, 08/14/20, personally viewed radiology report as part of my medical decision making, as well as reviewing the written report by the radiologist.   Official radiology report(s): Tommi Rumps Abdomen Limited  Result Date: 08/12/2020 CLINICAL DATA:  Evaluate for abscess in right lower quadrant. EXAM: ULTRASOUND ABDOMEN LIMITED COMPARISON:  None. FINDINGS: Soft tissue edema.  No abscess identified. IMPRESSION: Soft tissue edema without identified abscess. Electronically Signed   By: 08/14/2020 III M.D   On: 08/12/2020 15:01    ____________________________________________   PROCEDURES  Procedure(s) performed (including Critical  Care):  Procedures   ____________________________________________   INITIAL IMPRESSION / ASSESSMENT AND PLAN / ED COURSE  As part of my medical decision making, I reviewed the following data within the electronic MEDICAL RECORD NUMBER Notes from prior ED visits and  Controlled Substance Database  39 year old female presents to the ED with concerns of continued abscess.  She currently is taking 2 antibiotics and states that the area that initially was cellulitic has improved greatly.  She also had an I&D which has minimal drainage that continues.  Patient denies any fever, chills, nausea or vomiting.  On exam cellulitis has resolved and the incision site has minimal drainage present.  Lab work was reassuring and ultrasound showed that there was no abscess formation in this area.  Patient is encouraged to use warm compresses frequently and to continue taking antibiotic until completely finished.  She is to follow-up with her PCP or [facility] if any continued problems.  She is to return  to the emergency department if any urgent concerns or worsening of her situation.  ____________________________________________   FINAL CLINICAL IMPRESSION(S) / ED DIAGNOSES  Final diagnoses:  Soft tissue abscess     ED Discharge Orders    None      *Please note:  AHLANA SLAYDON was evaluated in Emergency Department on 08/12/2020 for the symptoms described in the history of present illness. She was evaluated in the context of the global COVID-19 pandemic, which necessitated consideration that the patient might be at risk for infection with the SARS-CoV-2 virus that causes COVID-19. Institutional protocols and algorithms that pertain to the evaluation of patients at risk for COVID-19 are in a state of rapid change based on information released by regulatory bodies including the CDC and federal and state organizations. These policies and algorithms were followed during the patient's care in the ED.   Some ED evaluations and interventions may be delayed as a result of limited staffing during and the pandemic.*   Note:  This document was prepared using Dragon voice recognition software and may include unintentional dictation errors.    Tommi Rumps, PA-C 08/12/20 1531    Chesley Noon, MD 08/12/20 1535

## 2020-08-12 NOTE — Discharge Instructions (Addendum)
Follow-up with your primary care provider or [facility] acute care if any continued problems.  Return to the emergency department if any redness, fever, chills or urgent concerns.  Continue taking your antibiotic until completely finished.  You may also use warm compresses 2-3 times a day which also will help with healing of this area.

## 2020-08-12 NOTE — ED Triage Notes (Signed)
Pt arrived via POV with reports of abcess to abdomen, pt states it needs to be drained today.  Continues to take antibiotics.

## 2020-08-12 NOTE — ED Notes (Signed)
Patient to ED for mass on right lower abdomen. Has been draining and she has a dressing over it. Appears cellulitic at first glance. States she was here last night but didn't stay due to the wait time.

## 2020-08-12 NOTE — ED Notes (Signed)
Pt tearful, states she is upset because she cannot go to surgeon due to lack of insurance. Spoke with pt about plan of care and reassurance given. EDP notified of pt concerns.

## 2020-10-26 IMAGING — DX DG CHEST 1V PORT
1 series · 1 of 1 positions shown · non-contrast
Comparison: None.

CLINICAL DATA: Cough 2 weeks ago with wheezing. Negative [id number]
test.

EXAM:
PORTABLE CHEST 1 VIEW

[chest ap]
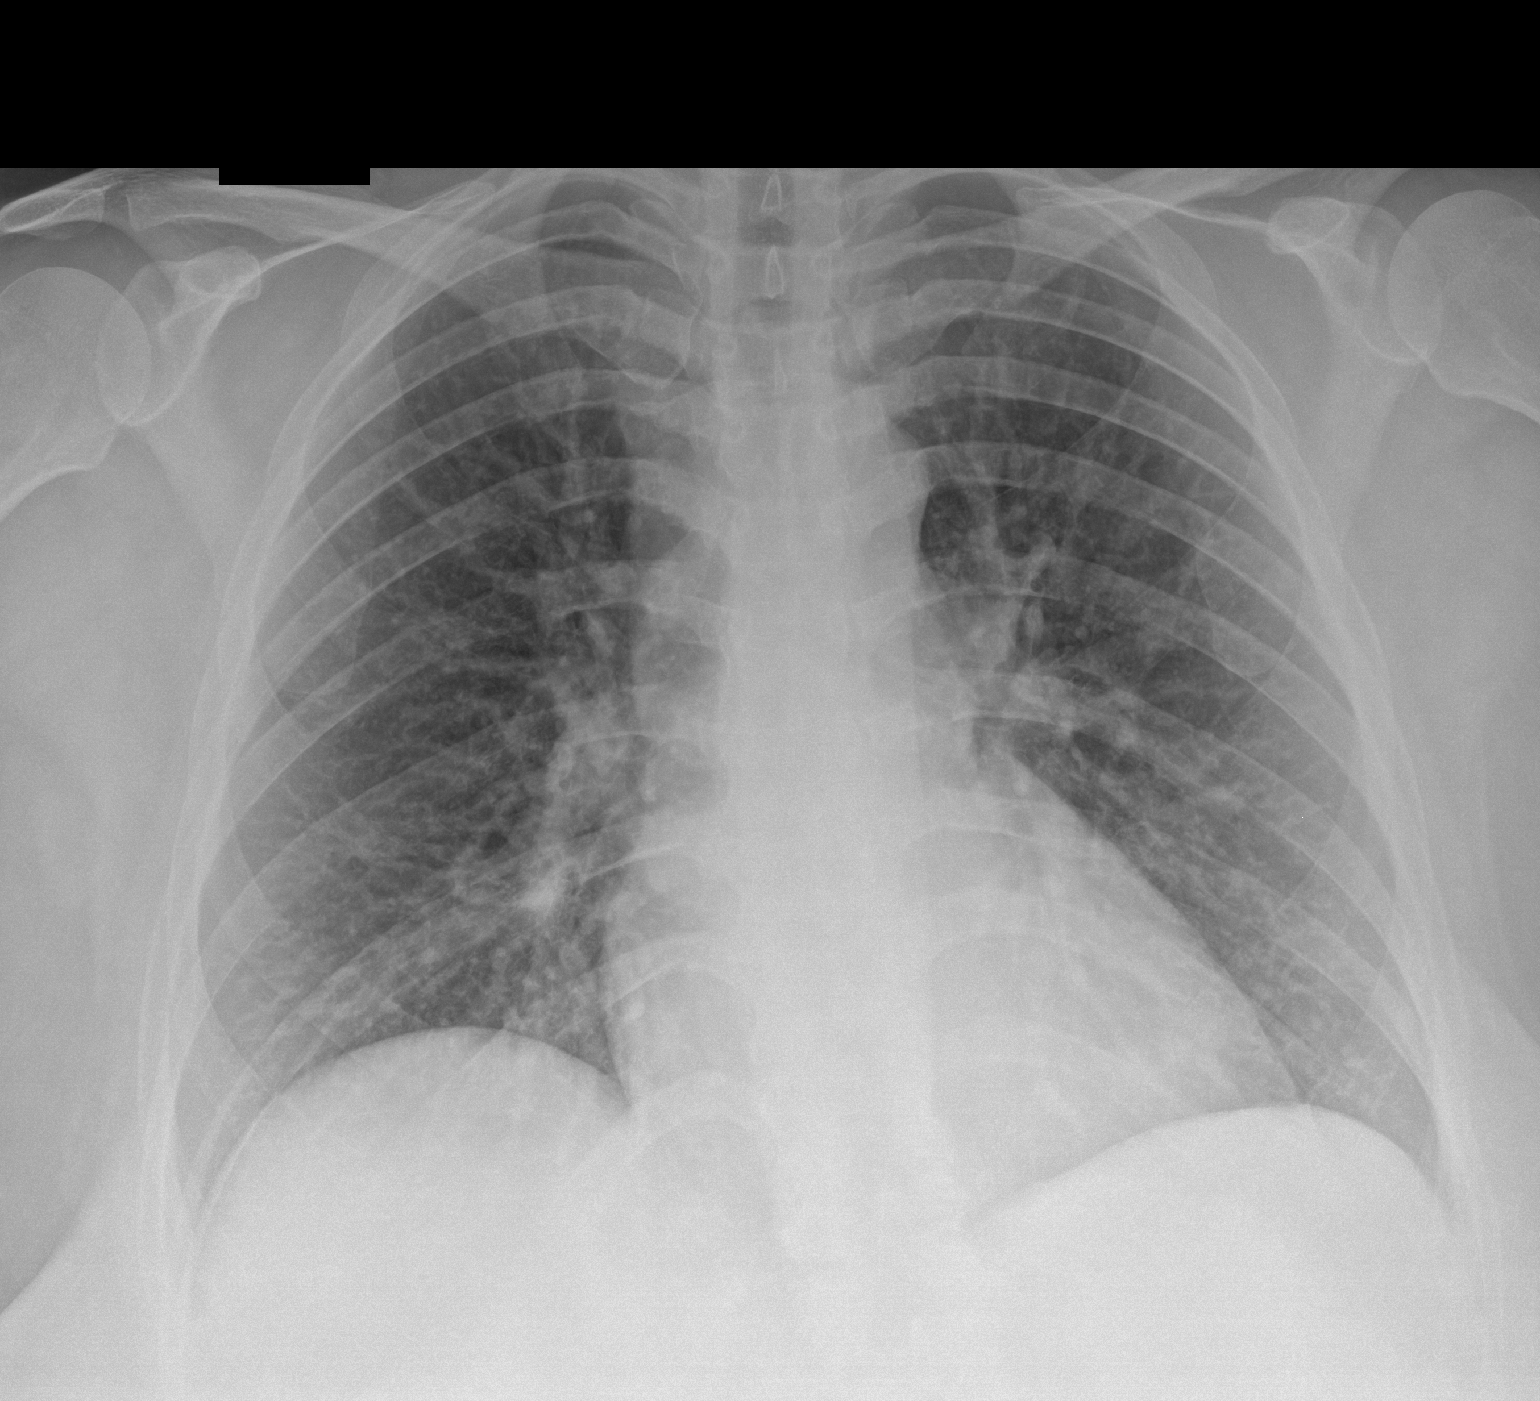

[1 of 1 positions shown; findings below may reference images not displayed]

FINDINGS: Lungs are adequately inflated and otherwise clear. Cardiomediastinal
silhouette, bones and soft tissues are normal.
IMPRESSION: No active disease.

## 2021-05-16 IMAGING — US US ABDOMEN LIMITED
1 series · 14 of 16 positions shown · non-contrast
Comparison: None.

CLINICAL DATA: Evaluate for abscess in right lower quadrant.

EXAM:
ULTRASOUND ABDOMEN LIMITED

[Series 1: us abdomen limited · 16 acquisitions, 14 frames shown]
[im 1/16]
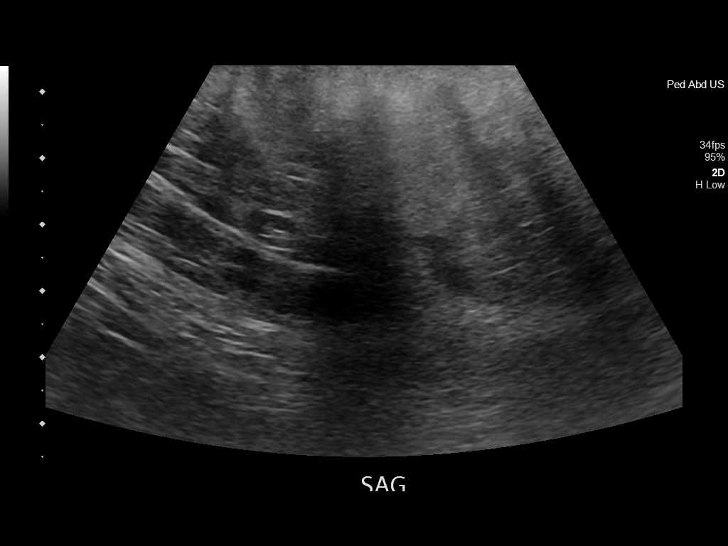
[im 2/16]
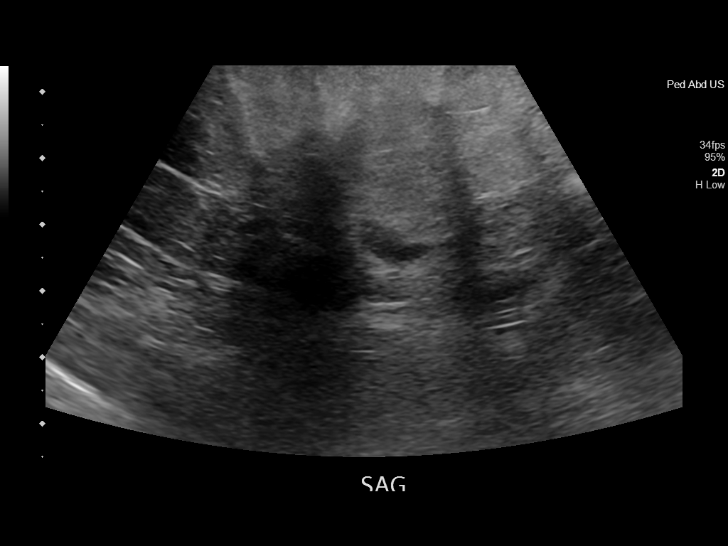
[im 3/16]
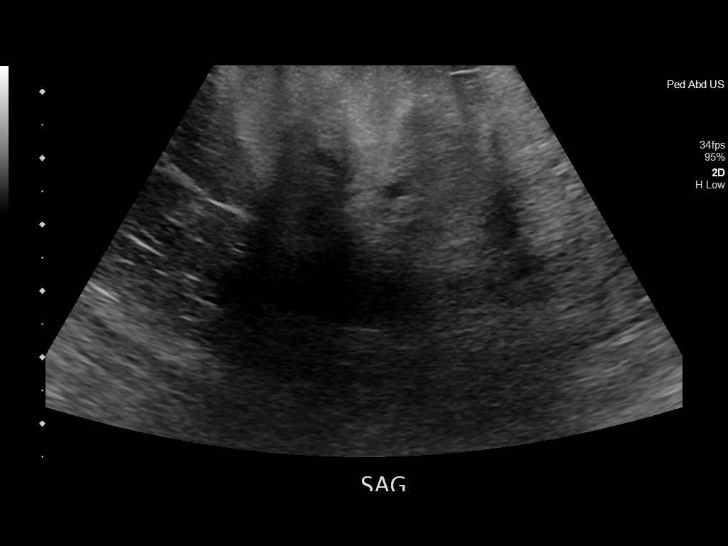
[im 5/16]
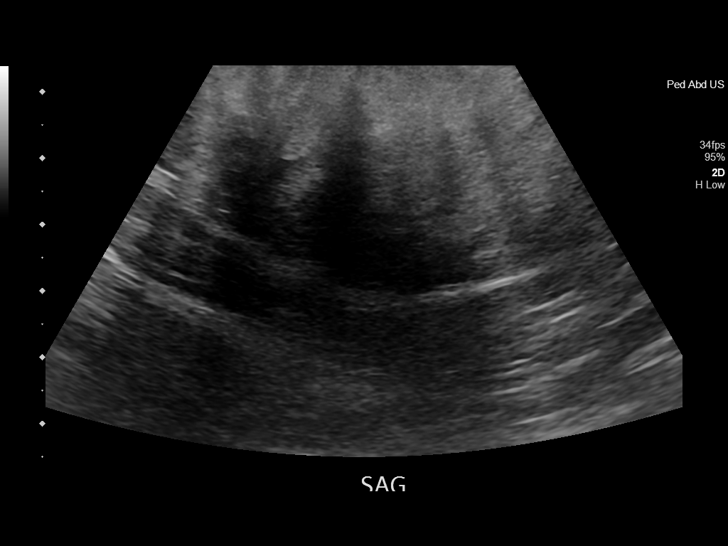
[im 6/16]
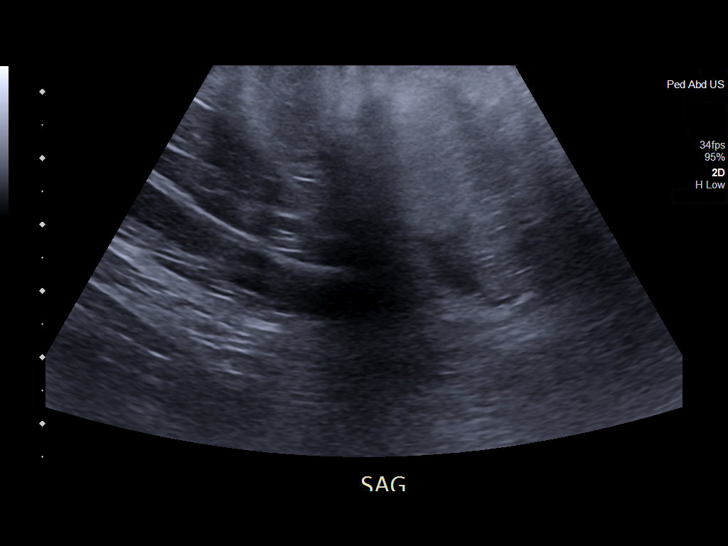
[im 7/16]
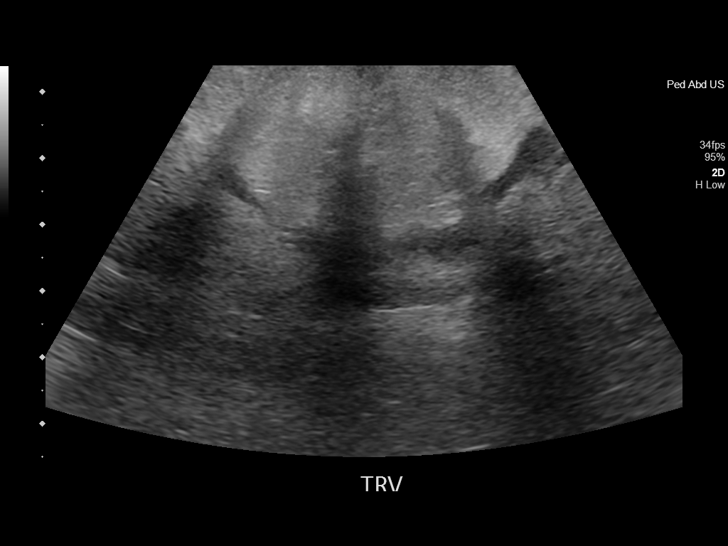
[im 8/16]
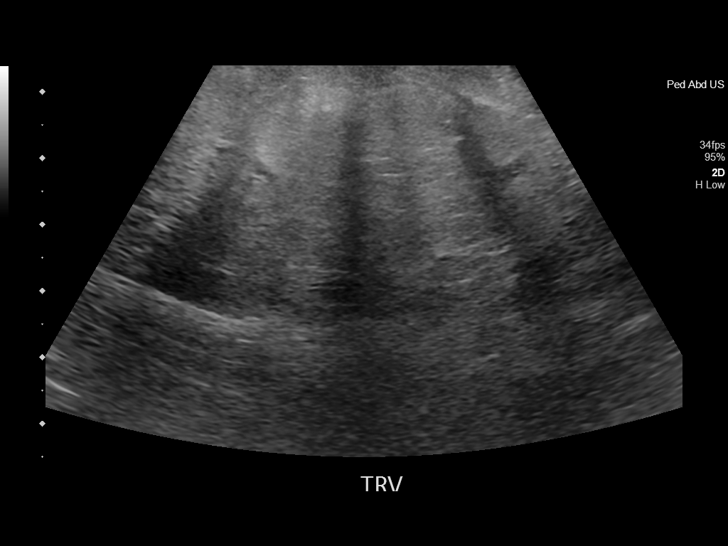
[im 9/16]
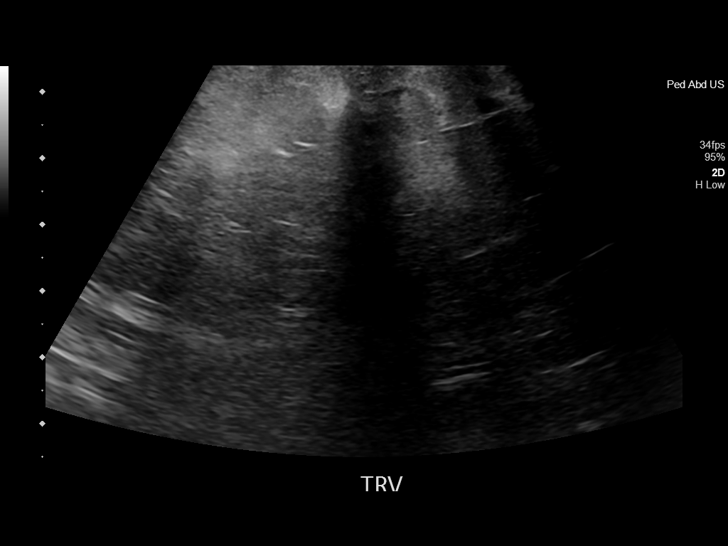
[im 10/16]
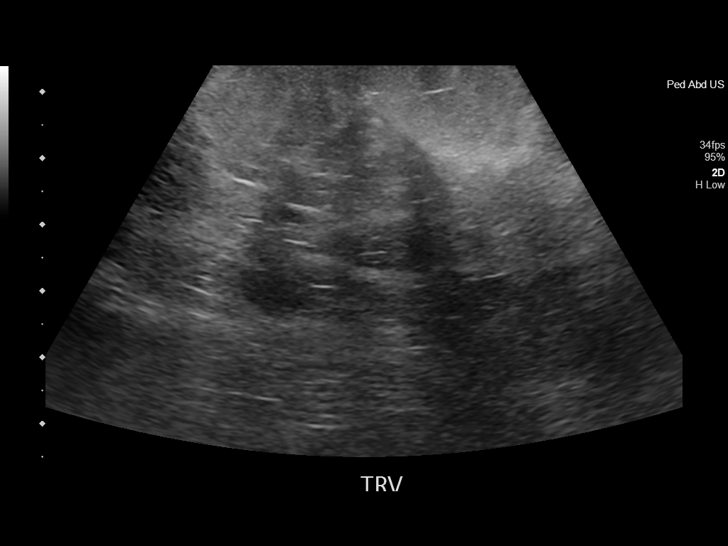
[im 11/16]
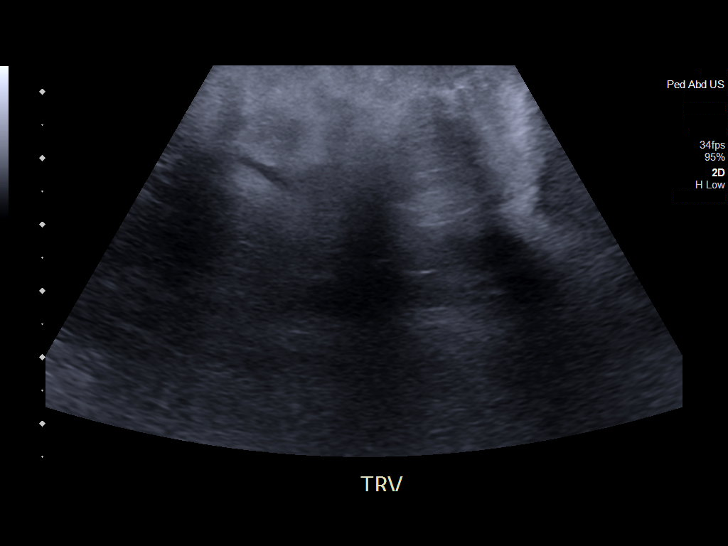
[im 13/16]
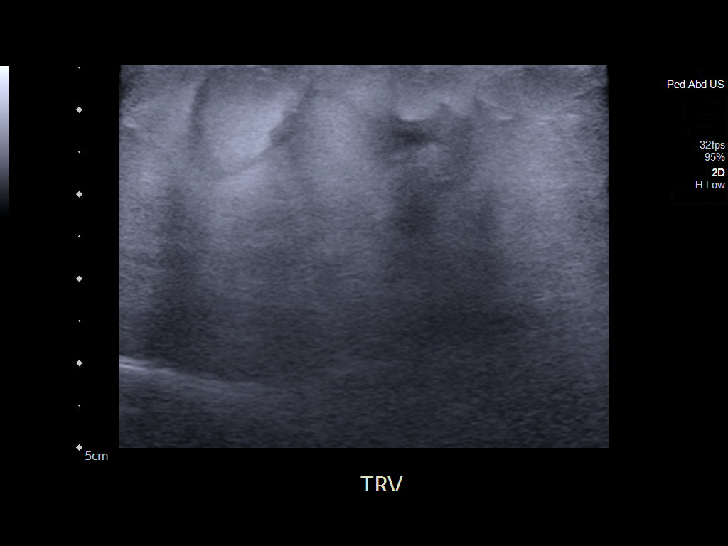
[im 14/16]
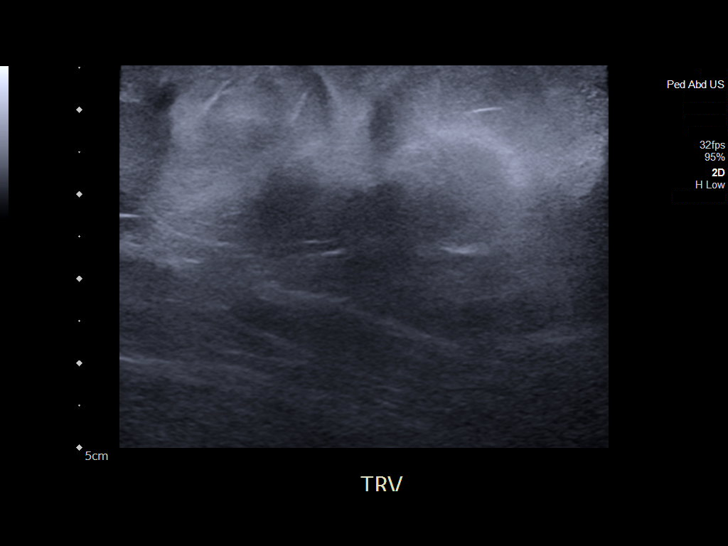
[im 15/16]
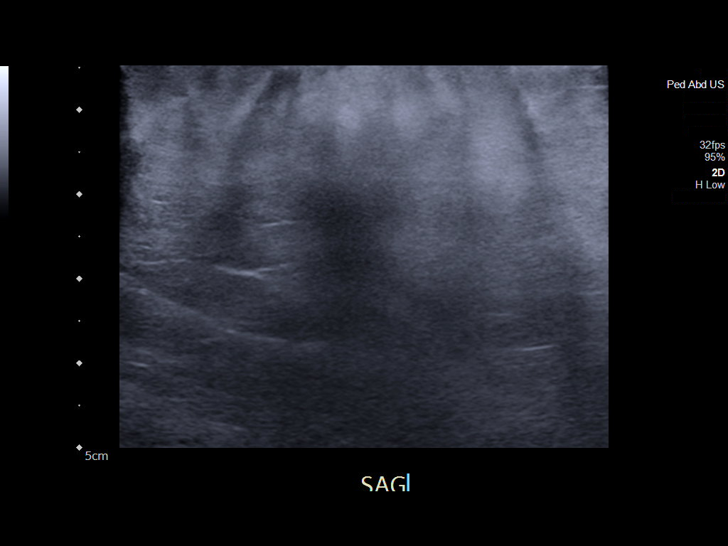
[im 16/16]
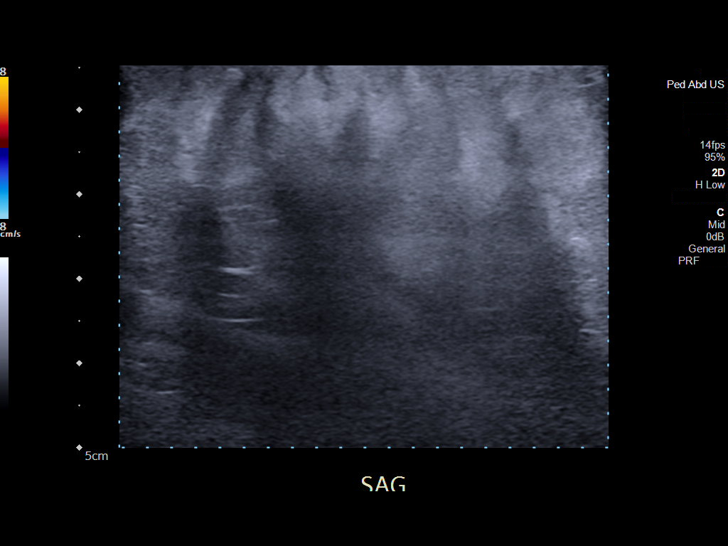

[14 of 16 positions shown; findings below may reference images not displayed]

FINDINGS: Soft tissue edema.  No abscess identified.
IMPRESSION: Soft tissue edema without identified abscess.

## 2023-06-21 ENCOUNTER — Other Ambulatory Visit: Payer: Self-pay

## 2023-06-21 ENCOUNTER — Emergency Department
Admission: EM | Admit: 2023-06-21 | Discharge: 2023-06-21 | Disposition: A | Discharge status: Home / Self Care | Payer: BC Managed Care – PPO | Attending: Emergency Medicine | Admitting: Emergency Medicine

## 2023-06-21 DIAGNOSIS — S91111A Laceration without foreign body of right great toe without damage to nail, initial encounter: Secondary | ICD-10-CM | POA: Diagnosis not present

## 2023-06-21 DIAGNOSIS — W5501XA Bitten by cat, initial encounter: Secondary | ICD-10-CM | POA: Diagnosis not present

## 2023-06-21 DIAGNOSIS — J45909 Unspecified asthma, uncomplicated: Secondary | ICD-10-CM | POA: Diagnosis not present

## 2023-06-21 DIAGNOSIS — S99921A Unspecified injury of right foot, initial encounter: Secondary | ICD-10-CM | POA: Diagnosis present

## 2023-06-21 MED ORDER — AMOXICILLIN-POT CLAVULANATE 875-125 MG PO TABS
1.0000 | ORAL_TABLET | Freq: Two times a day (BID) | ORAL | 0 refills | Status: AC
Start: 2023-06-21 — End: 2023-06-26

## 2023-06-21 MED ORDER — AMOXICILLIN-POT CLAVULANATE 875-125 MG PO TABS
1.0000 | ORAL_TABLET | Freq: Once | ORAL | Status: AC
  Administered 2023-06-21: 1 via ORAL
  Filled 2023-06-21: qty 1

## 2023-06-21 NOTE — Discharge Instructions (Addendum)
Take the antibiotics as prescribed.  You can apply antibiotic ointment to your wound and cover with a bandage at home.  Watch for signs of infection including redness, warmth, swelling, pain and pus drainage.  If you develop any of these please return to the ED, urgent care or your primary care provider.

## 2023-06-21 NOTE — ED Provider Notes (Signed)
[facility] Provider Note    Event Date/Time   First MD Initiated Contact with Patient 06/21/23 1705     (approximate)   History   Animal Bite (Cat )   HPI  Dana Garza is a 42 y.o. female PMH of asthma presents after being bit by her cat earlier today.  Patient states that her cat is up-to-date on vaccinations.  She cleaned the wound with alcohol, applied ice to it and took some ibuprofen.  Patient presented to the ED as she is concerned about cellulitis spreading to her toes.      Physical Exam   Triage Vital Signs: ED Triage Vitals  Encounter Vitals Group     BP 06/21/23 1639 107/66     Systolic BP Percentile --      Diastolic BP Percentile --      Pulse Rate 06/21/23 1639 76     Resp 06/21/23 1639 18     Temp 06/21/23 1639 98.3 F (36.8 C)     Temp Source 06/21/23 1639 Oral     SpO2 06/21/23 1639 97 %     Weight --      Height --      Head Circumference --      Peak Flow --      Pain Score 06/21/23 1640 7     Pain Loc --      Pain Education --      Exclude from Growth Chart --     Most recent vital signs: Vitals:   06/21/23 1639  BP: 107/66  Pulse: 76  Resp: 18  Temp: 98.3 F (36.8 C)  SpO2: 97%    General: Awake, no distress.  CV:  Good peripheral perfusion.  Resp:  Normal effort.  Abd:  No distention.  Other:  Superficial laceration just proximal to the right big toe, very mild swelling, no surrounding erythema, no discharge or bleeding.   ED Results / Procedures / Treatments   Labs (all labs ordered are listed, but only abnormal results are displayed) Labs Reviewed - No data to display   PROCEDURES:  Critical Care performed: No  Procedures   MEDICATIONS ORDERED IN ED: Medications  amoxicillin-clavulanate (AUGMENTIN) 875-125 MG per tablet 1 tablet (has no administration in time range)     IMPRESSION / MDM / ASSESSMENT AND PLAN / ED COURSE  I reviewed the triage vital signs and the nursing  notes.                             42 year old female presents for evaluation of a cat bite to her right foot.  Vital signs stable in triage patient hide your exam.  Differential diagnosis includes, but is not limited to, cat bite, wound infection, need for tetanus prophylaxis.  Patient's presentation is most consistent with acute, uncomplicated illness.  Wound does not appear infected at this time. Wound was cleaned with saline, and iodine and covered with a bandage. Patient was up-to-date on her tetanus shot.  Since it was a cat bite I will send oral antibiotics to her pharmacy.  She was given her first dose while in the ED today.  Patient can continue to take over-the-counter pain medications.    Patient voiced understanding, questions were answered and she was stable at discharge.   FINAL CLINICAL IMPRESSION(S) / ED DIAGNOSES   Final diagnoses:  Cat bite, initial encounter     Rx / DC  Orders   ED Discharge Orders          Ordered    amoxicillin-clavulanate (AUGMENTIN) 875-125 MG tablet  2 times daily        06/21/23 1721             Note:  This document was prepared using Dragon voice recognition software and may include unintentional dictation errors.   Cameron Ali, PA-C 06/21/23 1725    Janith Lima, MD 06/21/23 [phone]

## 2023-06-21 NOTE — ED Notes (Signed)
No redness noted around wound.

## 2023-06-21 NOTE — ED Triage Notes (Signed)
Pt states her cat bit her today- c/o right foot pain and swelling. Cat was upt to date on vaccinations per pt. Superficial laceration noted above right big toe, mild swelling noted, no redness or discharge noted. Pt AOX4, NAD. Foot dressed w/ gauze and nonskid sock applied.

## 2023-06-21 NOTE — ED Notes (Signed)
Message left with dispatch for [employer] regarding the patient's cat bite. Patient states it is her own cat and is up to date on shots.
